# Patient Record
Sex: Female | Born: 2000 | Race: White | Hispanic: No | Marital: Single | Smoking: Never smoker
Health system: Southern US, Community
[De-identification: ages and names within clinical notes are randomized; demographics above are authoritative.]

## PROBLEM LIST (undated history)

## (undated) ENCOUNTER — Emergency Department: Admission: EM | Payer: Self-pay | Source: Home / Self Care

---
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
## 2019-06-16 ENCOUNTER — Ambulatory Visit: Payer: Self-pay | Attending: Internal Medicine

## 2019-06-16 DIAGNOSIS — Z23 Encounter for immunization: Secondary | ICD-10-CM

## 2019-06-16 NOTE — Progress Notes (Signed)
   Covid-19 Vaccination Clinic  Name:  Darlene Estrada    MRN: [id number] DOB: [birth year]  06/16/2019  [name] was observed post Covid-19 immunization for 15 minutes without incident. She was provided with Vaccine Information Sheet and instruction to access the V-Safe system.   [name] was instructed to call 911 with any severe reactions post vaccine: Marland Kitchen Difficulty breathing  . Swelling of face and throat  . A fast heartbeat  . A bad rash all over body  . Dizziness and weakness   Immunizations Administered    Name Date Dose VIS Date Route   Pfizer COVID-19 Vaccine 06/16/2019  5:23 PM 0.3 mL 03/08/2019 Intramuscular   Manufacturer: [employer], [employer]   Lot: [id number]   NDC: [address]-0

## 2019-07-07 ENCOUNTER — Ambulatory Visit: Payer: Self-pay | Attending: Internal Medicine

## 2019-07-07 DIAGNOSIS — Z23 Encounter for immunization: Secondary | ICD-10-CM

## 2019-07-07 NOTE — Progress Notes (Signed)
   Covid-19 Vaccination Clinic  Name:  Darlene Estrada    MRN: [id number] DOB: 12/04/2000  07/07/2019  [name] was observed post Covid-19 immunization for 15 minutes without incident. She was provided with Vaccine Information Sheet and instruction to access the V-Safe system.   [name] was instructed to call 911 with any severe reactions post vaccine: Marland Kitchen Difficulty breathing  . Swelling of face and throat  . A fast heartbeat  . A bad rash all over body  . Dizziness and weakness   Immunizations Administered    Name Date Dose VIS Date Route   Pfizer COVID-19 Vaccine 07/07/2019  4:26 PM 0.3 mL 03/08/2019 Intramuscular   Manufacturer: [employer], [employer]   Lot: [id number]   NDC: [address]-2

## 2019-12-17 ENCOUNTER — Other Ambulatory Visit (HOSPITAL_COMMUNITY)
Admission: RE | Admit: 2019-12-17 | Discharge: 2019-12-17 | Disposition: A | Discharge status: Home / Self Care | Payer: Managed Care, Other (non HMO) | Source: Ambulatory Visit | Attending: Obstetrics and Gynecology | Admitting: Obstetrics and Gynecology

## 2019-12-17 ENCOUNTER — Ambulatory Visit (INDEPENDENT_AMBULATORY_CARE_PROVIDER_SITE_OTHER): Payer: Managed Care, Other (non HMO) | Admitting: Obstetrics and Gynecology

## 2019-12-17 ENCOUNTER — Other Ambulatory Visit: Payer: Self-pay

## 2019-12-17 ENCOUNTER — Encounter: Payer: Self-pay | Admitting: Obstetrics and Gynecology

## 2019-12-17 VITALS — BP 114/70 | HR 89 | Resp 16 | Ht 63.0 in | Wt 145.2 lb

## 2019-12-17 DIAGNOSIS — Z01419 Encounter for gynecological examination (general) (routine) without abnormal findings: Secondary | ICD-10-CM | POA: Diagnosis not present

## 2019-12-17 DIAGNOSIS — Z0000 Encounter for general adult medical examination without abnormal findings: Secondary | ICD-10-CM | POA: Diagnosis not present

## 2019-12-17 DIAGNOSIS — Z113 Encounter for screening for infections with a predominantly sexual mode of transmission: Secondary | ICD-10-CM | POA: Diagnosis not present

## 2019-12-17 DIAGNOSIS — R300 Dysuria: Secondary | ICD-10-CM

## 2019-12-17 DIAGNOSIS — Z3041 Encounter for surveillance of contraceptive pills: Secondary | ICD-10-CM | POA: Diagnosis not present

## 2019-12-17 MED ORDER — DROSPIRENONE-ETHINYL ESTRADIOL 3-0.03 MG PO TABS: 1.0000 | ORAL_TABLET | Freq: Every day | ORAL | 4 refills | Status: DC

## 2019-12-17 NOTE — Progress Notes (Signed)
Gynecology Annual Exam  PCP: Pcp, No  Chief Complaint:  Chief Complaint  Patient presents with  . Gynecologic Exam    annual exam    History of Present Illness: Patient is a 19 y.o. No obstetric history on file. presents for annual exam. The patient has no complaints today.   LMP: Patient's last menstrual period was 10/19/2019. Average Interval: regular, monthly Duration of flow: 5 days Heavy Menses: hx of menorrhagia- improved with OCP Clots: no Intermenstrual Bleeding: no Postcoital Bleeding: no Dysmenorrhea: yes, moderate, improved with OCP  The patient is sexually active. She currently uses OCP (estrogen/progesterone) for contraception. She denies dyspareunia.  The patient does perform self breast exams.  There is no notable family history of breast or ovarian cancer in her family.  The patient wears seatbelts: yes.  The patient has regular exercise: yes.    Review of Systems: Review of Systems  Constitutional: Negative for chills, fever, malaise/fatigue and weight loss.  HENT: Negative for congestion, hearing loss and sinus pain.   Eyes: Negative for blurred vision and double vision.  Respiratory: Negative for cough, sputum production, shortness of breath and wheezing.   Cardiovascular: Negative for chest pain, palpitations, orthopnea and leg swelling.  Gastrointestinal: Negative for abdominal pain, constipation, diarrhea, nausea and vomiting.  Genitourinary: Negative for dysuria, flank pain, frequency, hematuria and urgency.  Musculoskeletal: Negative for back pain, falls and joint pain.  Skin: Negative for itching and rash.  Neurological: Negative for dizziness and headaches.  Psychiatric/Behavioral: Negative for depression, substance abuse and suicidal ideas. The patient is not nervous/anxious.     Past Medical History:  There are no problems to display for this patient.   Past Surgical History:  History reviewed. No pertinent surgical  history.  Gynecologic History:  Patient's last menstrual period was 10/19/2019.  Reports a history of a 10 cm dermoid cyst on left ovary, laparoscopic removal, small remaining ovary.  Contraception: OCP (estrogen/progesterone) Pap: iniate at  20  Obstetric History: No obstetric history on file.  Family History:  History reviewed. No pertinent family history.  Social History:  Social History   Socioeconomic History  . Marital status: Single    Spouse name: Not on file  . Number of children: Not on file  . Years of education: Not on file  . Highest education level: Not on file  Occupational History  . Not on file  Tobacco Use  . Smoking status: Never Smoker  . Smokeless tobacco: Never Used  Vaping Use  . Vaping Use: Never assessed  Substance and Sexual Activity  . Alcohol use: Not on file    Comment: occ  . Drug use: Never  . Sexual activity: Yes    Birth control/protection: Pill  Other Topics Concern  . Not on file  Social History Narrative  . Not on file   Social Determinants of Health   Financial Resource Strain:   . Difficulty of Paying Living Expenses: Not on file  Food Insecurity:   . Worried About Programme researcher, broadcasting/film/video in the Last Year: Not on file  . Ran Out of Food in the Last Year: Not on file  Transportation Needs:   . Lack of Transportation (Medical): Not on file  . Lack of Transportation (Non-Medical): Not on file  Physical Activity:   . Days of Exercise per Week: Not on file  . Minutes of Exercise per Session: Not on file  Stress:   . Feeling of Stress : Not on file  Social Connections:   . Frequency of Communication with Friends and Family: Not on file  . Frequency of Social Gatherings with Friends and Family: Not on file  . Attends Religious Services: Not on file  . Active Member of Clubs or Organizations: Not on file  . Attends Banker Meetings: Not on file  . Marital Status: Not on file  Intimate Partner Violence:   . Fear of  Current or Ex-Partner: Not on file  . Emotionally Abused: Not on file  . Physically Abused: Not on file  . Sexually Abused: Not on file    Allergies:  Allergies  Allergen Reactions  . Gluten Meal Other (See Comments)    Severe stomach pain    Medications: Prior to Admission medications   Medication Sig Start Date End Date Taking? Authorizing Provider  drospirenone-ethinyl estradiol (YASMIN) 3-0.03 MG tablet Take 1 tablet by mouth daily.    [provider]    Physical Exam Vitals: Blood pressure 114/70, pulse 89, resp. rate 16, height 5\' 3"  (1.6 m), weight 145 lb 3.2 oz (65.9 kg), last menstrual period 10/19/2019, SpO2 98 %.  General: NAD HEENT: normocephalic, anicteric Thyroid: no enlargement, no palpable nodules Pulmonary: No increased work of breathing, CTAB Cardiovascular: RRR, distal pulses 2+ Breast: Breast symmetrical, no tenderness, no palpable nodules or masses, no skin or nipple retraction present, no nipple discharge.  No axillary or supraclavicular lymphadenopathy. Abdomen: NABS, soft, non-tender, non-distended.  Umbilicus without lesions.  No hepatomegaly, splenomegaly or masses palpable. No evidence of hernia  Genitourinary:  External: Normal external female genitalia.  Normal urethral meatus, normal Bartholin's and Skene's glands.    Vagina: Normal vaginal mucosa, no evidence of prolapse.    Cervix: Grossly normal in appearance, no bleeding  Uterus: Non-enlarged, mobile, normal contour.  No CMT  Adnexa: ovaries non-enlarged, no adnexal masses  Rectal: deferred  Lymphatic: no evidence of inguinal lymphadenopathy Extremities: no edema, erythema, or tenderness Neurologic: Grossly intact Psychiatric: mood appropriate, affect full  Female chaperone present for pelvic and breast  portions of the physical exam    Assessment: 19 y.o. No obstetric history on file. routine annual exam  Plan: Problem List Items Addressed This Visit    None    Visit  Diagnoses    Encounter for gynecological examination without abnormal finding    -  Primary   Health maintenance examination       Encounter for annual routine gynecological examination       Screen for STD (sexually transmitted disease)       Relevant Orders   Cervicovaginal ancillary only     1) STI screening  was offered and accepted  2)  ASCCP guidelines and rational discussed.  Patient opts for iniation at 20.  3) Contraception - the patient is currently using  OCP (estrogen/progesterone).  She is happy with her current form of contraception and plans to continue We discussed safe sex practices to reduce her furture risk of STI's.    4) Return in about 1 year (around 12/16/2020) for annual.   12/18/2020 MD, Adelene Idler OB/[facility]N, [facility] 12/17/2019 11:13 AM

## 2019-12-17 NOTE — Patient Instructions (Signed)
Exercising to Stay Healthy To become healthy and stay healthy, it is recommended that you do moderate-intensity and vigorous-intensity exercise. You can tell that you are exercising at a moderate intensity if your heart starts beating faster and you start breathing faster but can still hold a conversation. You can tell that you are exercising at a vigorous intensity if you are breathing much harder and faster and cannot hold a conversation while exercising. Exercising regularly is important. It has many health benefits, such as:  Improving overall fitness, flexibility, and endurance.  Increasing bone density.  Helping with weight control.  Decreasing body fat.  Increasing muscle strength.  Reducing stress and tension.  Improving overall health. How often should I exercise? Choose an activity that you enjoy, and set realistic goals. Your health care provider can help you make an activity plan that works for you. Exercise regularly as told by your health care provider. This may include:  Doing strength training two times a week, such as: ? Lifting weights. ? Using resistance bands. ? Push-ups. ? Sit-ups. ? Yoga.  Doing a certain intensity of exercise for a given amount of time. Choose from these options: ? A total of 150 minutes of moderate-intensity exercise every week. ? A total of 75 minutes of vigorous-intensity exercise every week. ? A mix of moderate-intensity and vigorous-intensity exercise every week. Children, pregnant women, people who have not exercised regularly, people who are overweight, and older adults may need to talk with a health care provider about what activities are safe to do. If you have a medical condition, be sure to talk with your health care provider before you start a new exercise program. What are some exercise ideas? Moderate-intensity exercise ideas include:  Walking 1 mile (1.6 km) in about 15  minutes.  Biking.  Hiking.  Golfing.  Dancing.  Water aerobics. Vigorous-intensity exercise ideas include:  Walking 4.5 miles (7.2 km) or more in about 1 hour.  Jogging or running 5 miles (8 km) in about 1 hour.  Biking 10 miles (16.1 km) or more in about 1 hour.  Lap swimming.  Roller-skating or in-line skating.  Cross-country skiing.  Vigorous competitive sports, such as football, basketball, and soccer.  Jumping rope.  Aerobic dancing. What are some everyday activities that can help me to get exercise?  Yard work, such as: ? Pushing a lawn mower. ? Raking and bagging leaves.  Washing your car.  Pushing a stroller.  Shoveling snow.  Gardening.  Washing windows or floors. How can I be more active in my day-to-day activities?  Use stairs instead of an elevator.  Take a walk during your lunch break.  If you drive, park your car farther away from your work or school.  If you take public transportation, get off one stop early and walk the rest of the way.  Stand up or walk around during all of your indoor phone calls.  Get up, stretch, and walk around every 30 minutes throughout the day.  Enjoy exercise with a friend. Support to continue exercising will help you keep a regular routine of activity. What guidelines can I follow while exercising?  Before you start a new exercise program, talk with your health care provider.  Do not exercise so much that you hurt yourself, feel dizzy, or get very short of breath.  Wear comfortable clothes and wear shoes with good support.  Drink plenty of water while you exercise to prevent dehydration or heat stroke.  Work out until your breathing   and your heartbeat get faster. Where to find more information  [place] Department of Health and Human Services: www.hhs.gov  Centers for Disease Control and Prevention (CDC): www.cdc.gov Summary  Exercising regularly is important. It will improve your overall fitness,  flexibility, and endurance.  Regular exercise also will improve your overall health. It can help you control your weight, reduce stress, and improve your bone density.  Do not exercise so much that you hurt yourself, feel dizzy, or get very short of breath.  Before you start a new exercise program, talk with your health care provider. This information is not intended to replace advice given to you by your health care provider. Make sure you discuss any questions you have with your health care provider. Document Revised: 02/24/2017 Document Reviewed: 02/02/2017 Elsevier Patient Education  2020 Else[employer] Inc.   Budget-Friendly Healthy Eating There are many ways to save money at the grocery store and continue to eat healthy. You can be successful if you:  Plan meals according to your budget.  Make a grocery list and only purchase food according to your grocery list.  Prepare food yourself. What are tips for following this plan?  Reading food labels  Compare food labels between brand name foods and the store brand. Often the nutritional value is the same, but the store brand is lower cost.  Look for products that do not have added sugar, fat, or salt (sodium). These often cost the same but are healthier for you. Products may be labeled as: ? Sugar-free. ? Nonfat. ? Low-fat. ? Sodium-free. ? Low-sodium.  Look for lean ground beef labeled as at least 92% lean and 8% fat. Shopping  Buy only the items on your grocery list and go only to the areas of the store that have the items on your list.  Use coupons only for foods and brands you normally buy. Avoid buying items you wouldn't normally buy simply because they are on sale.  Check online and in newspapers for weekly deals.  Buy healthy items from the bulk bins when available, such as herbs, spices, flour, pasta, nuts, and dried fruit.  Buy fruits and vegetables that are in season. Prices are usually lower on in-season  produce.  Look at the unit price on the price tag. Use it to compare different brands and sizes to find out which item is the best deal.  Choose healthy items that are often low-cost, such as carrots, potatoes, apples, bananas, and oranges. Dried or canned beans are a low-cost protein source.  Buy in bulk and freeze extra food. Items you can buy in bulk include meats, fish, poultry, frozen fruits, and frozen vegetables.  Avoid buying "ready-to-eat" foods, such as pre-cut fruits and vegetables and pre-made salads.  If possible, shop around to discover where you can find the best prices. Consider other retailers such as dollar stores, larger wholesale stores, local fruit and vegetable stands, and farmers markets.  Do not shop when you are hungry. If you shop while hungry, it may be hard to stick to your list and budget.  Resist impulse buying. Use your grocery list as your official plan for the week.  Buy a variety of vegetables and fruits by purchasing fresh, frozen, and canned items.  Look at the top and bottom shelves for deals. Foods at eye level (eye level of an adult or child) are usually more expensive.  Be efficient with your time when shopping. The more time you spend at the store, the more money you   are likely to spend.  To save money when choosing more expensive foods like meats and dairy: ? Choose cheaper cuts of meat, such as bone-in chicken thighs and drumsticks instead of skinless and boneless chicken. When you are ready to prepare the chicken, you can remove the skin yourself to make it healthier. ? Choose lean meats like chicken or turkey instead of beef. ? Choose canned seafood, such as tuna, salmon, or sardines. ? Buy eggs as a low-cost source of protein. ? Buy dried beans and peas, such as lentils, split peas, or kidney beans instead of meats. Dried beans and peas are a good alternative source of protein. ? Buy the larger tubs of yogurt instead of individual-sized  containers.  Choose water instead of sodas and other sweetened beverages.  Avoid buying chips, cookies, and other "junk food." These items are usually expensive and not healthy. Cooking  Make extra food and freeze the extras in meal-sized containers or in individual portions for fast meals and snacks.  Pre-cook on days when you have extra time to prepare meals in advance. You can keep these meals in the fridge or freezer and reheat for a quick meal.  When you come home from the grocery store, wash, peel, and cut fruits and vegetables so they are ready to use and eat. This will help reduce food waste. Meal planning  Do not eat out or get fast food. Prepare food at home.  Make a grocery list and make sure to bring it with you to the store. If you have a smart phone, you could use your phone to create your shopping list.  Plan meals and snacks according to a grocery list and budget you create.  Use leftovers in your meal plan for the week.  Look for recipes where you can cook once and make enough food for two meals.  Include budget-friendly meals like stews, casseroles, and stir-fry dishes.  Try some meatless meals or try "no cook" meals like salads.  Make sure that half your plate is filled with fruits or vegetables. Choose from fresh, frozen, or canned fruits and vegetables. If eating canned, remember to rinse them before eating. This will remove any excess salt added for packaging. Summary  Eating healthy on a budget is possible if you plan your meals according to your budget, purchase according to your budget and grocery list, and prepare food yourself.  Tips for buying more food on a limited budget include buying generic brands, using coupons only for foods you normally buy, and buying healthy items from the bulk bins when available.  Tips for buying cheaper food to replace expensive food include choosing cheaper, lean cuts of meat, and buying dried beans and peas. This  information is not intended to replace advice given to you by your health care provider. Make sure you discuss any questions you have with your health care provider. Document Revised: 03/15/2017 Document Reviewed: 03/15/2017 Elsevier Patient Education  2020 [employer] Inc.   Bone Health Bones protect organs, store calcium, anchor muscles, and support the whole body. Keeping your bones strong is important, especially as you get older. You can take actions to help keep your bones strong and healthy. Why is keeping my bones healthy important?  Keeping your bones healthy is important because your body constantly replaces bone cells. Cells get old, and new cells take their place. As we age, we lose bone cells because the body may not be able to make enough new cells to replace the   old cells. The amount of bone cells and bone tissue you have is referred to as bone mass. The higher your bone mass, the stronger your bones. The aging process leads to an overall loss of bone mass in the body, which can increase the likelihood of:  Joint pain and stiffness.  Broken bones.  A condition in which the bones become weak and brittle (osteoporosis). A large decline in bone mass occurs in older adults. In women, it occurs about the time of menopause. What actions can I take to keep my bones healthy? Good health habits are important for maintaining healthy bones. This includes eating nutritious foods and exercising regularly. To have healthy bones, you need to get enough of the right minerals and vitamins. Most nutrition experts recommend getting these nutrients from the foods that you eat. In some cases, taking supplements may also be recommended. Doing certain types of exercise is also important for bone health. What are the nutritional recommendations for healthy bones?  Eating a well-balanced diet with plenty of calcium and vitamin D will help to protect your bones. Nutritional recommendations vary from person  to person. Ask your health care provider what is healthy for you. Here are some general guidelines. Get enough calcium Calcium is the most important (essential) mineral for bone health. Most people can get enough calcium from their diet, but supplements may be recommended for people who are at risk for osteoporosis. Good sources of calcium include:  Dairy products, such as low-fat or nonfat milk, cheese, and yogurt.  Dark green leafy vegetables, such as bok choy and broccoli.  Calcium-fortified foods, such as orange juice, cereal, bread, soy beverages, and tofu products.  Nuts, such as almonds. Follow these recommended amounts for daily calcium intake:  Children, age 1-3: 700 mg.  Children, age 4-8: 1,000 mg.  Children, age 9-13: 1,300 mg.  Teens, age 14-18: 1,300 mg.  Adults, age 19-50: 1,000 mg.  Adults, age 51-70: ? Men: 1,000 mg. ? Women: 1,200 mg.  Adults, age 71 or older: 1,200 mg.  Pregnant and breastfeeding females: ? Teens: 1,300 mg. ? Adults: 1,000 mg. Get enough vitamin D Vitamin D is the most essential vitamin for bone health. It helps the body absorb calcium. Sunlight stimulates the skin to make vitamin D, so be sure to get enough sunlight. If you live in a cold climate or you do not get outside often, your health care provider may recommend that you take vitamin D supplements. Good sources of vitamin D in your diet include:  Egg yolks.  Saltwater fish.  Milk and cereal fortified with vitamin D. Follow these recommended amounts for daily vitamin D intake:  Children and teens, age 1-18: 600 international units.  Adults, age 50 or younger: 400-800 international units.  Adults, age 51 or older: 800-1,000 international units. Get other important nutrients Other nutrients that are important for bone health include:  Phosphorus. This mineral is found in meat, poultry, dairy foods, nuts, and legumes. The recommended daily intake for adult men and adult women is  700 mg.  Magnesium. This mineral is found in seeds, nuts, dark green vegetables, and legumes. The recommended daily intake for adult men is 400-420 mg. For adult women, it is 310-320 mg.  Vitamin K. This vitamin is found in green leafy vegetables. The recommended daily intake is 120 mg for adult men and 90 mg for adult women. What type of physical activity is best for building and maintaining healthy bones? Weight-bearing and strength-building activities are   important for building and maintaining healthy bones. Weight-bearing activities cause muscles and bones to work against gravity. Strength-building activities increase the strength of the muscles that support bones. Weight-bearing and muscle-building activities include:  Walking and hiking.  Jogging and running.  Dancing.  Gym exercises.  Lifting weights.  Tennis and racquetball.  Climbing stairs.  Aerobics. Adults should get at least 30 minutes of moderate physical activity on most days. Children should get at least 60 minutes of moderate physical activity on most days. Ask your health care provider what type of exercise is best for you. How can I find out if my bone mass is low? Bone mass can be measured with an X-ray test called a bone mineral density (BMD) test. This test is recommended for all women who are age 65 or older. It may also be recommended for:  Men who are age 70 or older.  People who are at risk for osteoporosis because of: ? Having bones that break easily. ? Having a long-term disease that weakens bones, such as kidney disease or rheumatoid arthritis. ? Having menopause earlier than normal. ? Taking medicine that weakens bones, such as steroids, thyroid hormones, or hormone treatment for breast cancer or prostate cancer. ? Smoking. ? Drinking three or more alcoholic drinks a day. If you find that you have a low bone mass, you may be able to prevent osteoporosis or further bone loss by changing your diet and  lifestyle. Where can I find more information? For more information, check out the following websites:  National Osteoporosis Foundation: www.nof.org/patients  National Institutes of Health: www.bones.nih.gov  International Osteoporosis Foundation: www.iofbonehealth.org Summary  The aging process leads to an overall loss of bone mass in the body, which can increase the likelihood of broken bones and osteoporosis.  Eating a well-balanced diet with plenty of calcium and vitamin D will help to protect your bones.  Weight-bearing and strength-building activities are also important for building and maintaining strong bones.  Bone mass can be measured with an X-ray test called a bone mineral density (BMD) test. This information is not intended to replace advice given to you by your health care provider. Make sure you discuss any questions you have with your health care provider. Document Revised: 04/10/2017 Document Reviewed: 04/10/2017 Elsevier Patient Education  2020 Elsevier Inc.   

## 2019-12-19 LAB — CERVICOVAGINAL ANCILLARY ONLY
Chlamydia: NEGATIVE
Comment: NEGATIVE
Comment: NORMAL
Neisseria Gonorrhea: NEGATIVE

## 2019-12-19 LAB — URINE CULTURE: Organism ID, Bacteria: NO GROWTH

## 2020-10-24 ENCOUNTER — Other Ambulatory Visit: Payer: Self-pay | Admitting: Obstetrics and Gynecology

## 2020-10-24 DIAGNOSIS — Z3041 Encounter for surveillance of contraceptive pills: Secondary | ICD-10-CM

## 2020-12-29 ENCOUNTER — Ambulatory Visit
Admission: EM | Admit: 2020-12-29 | Discharge: 2020-12-29 | Disposition: A | Discharge status: Home / Self Care | Payer: Managed Care, Other (non HMO) | Attending: Emergency Medicine | Admitting: Emergency Medicine

## 2020-12-29 ENCOUNTER — Ambulatory Visit (INDEPENDENT_AMBULATORY_CARE_PROVIDER_SITE_OTHER): Payer: Managed Care, Other (non HMO)

## 2020-12-29 ENCOUNTER — Other Ambulatory Visit: Payer: Self-pay

## 2020-12-29 ENCOUNTER — Encounter: Payer: Self-pay | Admitting: Emergency Medicine

## 2020-12-29 DIAGNOSIS — M79631 Pain in right forearm: Secondary | ICD-10-CM

## 2020-12-29 MED ORDER — IBUPROFEN 600 MG PO TABS: 600.0000 mg | ORAL_TABLET | Freq: Four times a day (QID) | ORAL | 0 refills | Status: AC | PRN

## 2020-12-29 NOTE — ED Triage Notes (Signed)
Pt here with right forearm pain x 2 days. Was lifting a heavy dresser, but otherwise, no mechanism of injury.

## 2020-12-29 NOTE — Discharge Instructions (Addendum)
Take the ibuprofen as prescribed.  Rest and elevate your forearm.  Apply ice packs 2-3 times a day for up to 20 minutes each.  Wear the sling as needed for comfort.    Follow up with an orthopedist if your symptoms are not improving.

## 2020-12-29 NOTE — ED Provider Notes (Signed)
[name]    CSN: [id number] Arrival date & time: 12/29/20  0859      History   Chief Complaint Chief Complaint  Patient presents with   Arm Injury    HPI Darlene Estrada is a 20 y.o. female.  Patient presents with pain in her right forearm x2 days.  The pain started when she was lifting a heavy dresser but patient states she also has hit her arm several times while moving things in the past few days.  She denies numbness, weakness, paresthesias, wounds, bruising, or other symptoms.  She denies pertinent medical history.  LMP: Current.  The history is provided by the patient.   History reviewed. No pertinent past medical history.  There are no problems to display for this patient.   Past Surgical History:  Procedure Laterality Date   LAPAROSCOPIC OVARIAN CYSTECTOMY  2015   Left ovarian dermoid removed, 10 cm    OB History   No obstetric history on file.      Home Medications    Prior to Admission medications   Medication Sig Start Date End Date Taking? Authorizing Provider  ibuprofen (ADVIL) 600 MG tablet Take 1 tablet (600 mg total) by mouth every 6 (six) hours as needed. 12/29/20  Yes Mickie Bail, NP  drospirenone-ethinyl estradiol (YASMIN) 3-0.03 MG tablet TAKE 1 TABLET BY MOUTH EVERY DAY *[facility] BRAND ONLY* 10/24/20   Natale Milch, MD    Family History History reviewed. No pertinent family history.  Social History Social History   Tobacco Use   Smoking status: Never   Smokeless tobacco: Never  Substance Use Topics   Drug use: Never     Allergies   Gluten meal   Review of Systems Review of Systems  Constitutional:  Negative for chills and fever.  Respiratory:  Negative for cough and shortness of breath.   Cardiovascular:  Negative for chest pain and palpitations.  Gastrointestinal:  Negative for abdominal pain and vomiting.  Musculoskeletal:  Positive for arthralgias and myalgias. Negative for joint swelling.  Skin:  Negative  for color change, rash and wound.  Neurological:  Negative for weakness and numbness.  All other systems reviewed and are negative.   Physical Exam Triage Vital Signs ED Triage Vitals  Enc Vitals Group     BP      Pulse      Resp      Temp      Temp src      SpO2      Weight      Height      Head Circumference      Peak Flow      Pain Score      Pain Loc      Pain Edu?      Excl. in GC?    No data found.  Updated Vital Signs BP 138/88 (BP Location: Left Arm)   Pulse 96   Temp 99 F (37.2 C) (Oral)   Resp 18   LMP 12/28/2020 (Exact Date)   SpO2 99%   Visual Acuity Right Eye Distance:   Left Eye Distance:   Bilateral Distance:    Right Eye Near:   Left Eye Near:    Bilateral Near:     Physical Exam Vitals and nursing note reviewed.  Constitutional:      General: She is not in acute distress.    Appearance: She is well-developed. She is not ill-appearing.  HENT:     Head:  Normocephalic and atraumatic.     Mouth/Throat:     Mouth: Mucous membranes are moist.  Eyes:     Conjunctiva/sclera: Conjunctivae normal.  Cardiovascular:     Rate and Rhythm: Normal rate and regular rhythm.     Heart sounds: Normal heart sounds.  Pulmonary:     Effort: Pulmonary effort is normal. No respiratory distress.     Breath sounds: Normal breath sounds.  Abdominal:     Palpations: Abdomen is soft.     Tenderness: There is no abdominal tenderness.  Musculoskeletal:        General: Tenderness present. No swelling or deformity.       Arms:     Cervical back: Neck supple.     Comments: Right forearm tender to palpation; limited ROM due to discomfort.   Skin:    General: Skin is warm and dry.     Capillary Refill: Capillary refill takes less than 2 seconds.     Findings: No bruising, erythema, lesion or rash.  Neurological:     General: No focal deficit present.     Mental Status: She is alert and oriented to person, place, and time.     Sensory: No sensory deficit.      Motor: No weakness.     Gait: Gait normal.  Psychiatric:        Mood and Affect: Mood normal.        Behavior: Behavior normal.     UC Treatments / Results  Labs (all labs ordered are listed, but only abnormal results are displayed) Labs Reviewed - No data to display  EKG   Radiology DG Forearm Right  Result Date: 12/29/2020 CLINICAL DATA:  Right forearm pain for 2 days after lifting heavy object EXAM: RIGHT FOREARM - 2 VIEW COMPARISON:  None. FINDINGS: There is no evidence of fracture or other focal bone lesions. Soft tissues are unremarkable. IMPRESSION: Negative. Electronically Signed   By: Duanne Guess D.O.   On: 12/29/2020 10:11    Procedures Procedures (including critical care time)  Medications Ordered in UC Medications - No data to display  Initial Impression / Assessment and Plan / UC Course  I have reviewed the triage vital signs and the nursing notes.  Pertinent labs & imaging results that were available during my care of the patient were reviewed by me and considered in my medical decision making (see chart for details).    Right forearm pain.  Xray negative.  Treating with ibuprofen, rest, elevation, ice packs, sling for comfort.  Education provided on musculoskeletal pain.  Instructed patient to follow-up with orthopedics if her symptoms are not improving.  She agrees to plan of care.    Final Clinical Impressions(s) / UC Diagnoses   Final diagnoses:  Right forearm pain     Discharge Instructions      Take the ibuprofen as prescribed.  Rest and elevate your forearm.  Apply ice packs 2-3 times a day for up to 20 minutes each.  Wear the sling as needed for comfort.    Follow up with an orthopedist if your symptoms are not improving.        ED Prescriptions     Medication Sig Dispense Auth. Provider   ibuprofen (ADVIL) 600 MG tablet Take 1 tablet (600 mg total) by mouth every 6 (six) hours as needed. 30 tablet Mickie Bail, NP      PDMP  not reviewed this encounter.   Mickie Bail, NP 12/29/20 1023

## 2021-04-22 ENCOUNTER — Other Ambulatory Visit: Payer: Self-pay | Admitting: Obstetrics and Gynecology

## 2021-04-22 DIAGNOSIS — Z3041 Encounter for surveillance of contraceptive pills: Secondary | ICD-10-CM

## 2021-06-14 ENCOUNTER — Encounter: Payer: Self-pay | Admitting: Emergency Medicine

## 2021-06-14 ENCOUNTER — Other Ambulatory Visit: Payer: Self-pay

## 2021-06-14 ENCOUNTER — Ambulatory Visit
Admission: EM | Admit: 2021-06-14 | Discharge: 2021-06-14 | Disposition: A | Discharge status: Home / Self Care | Payer: Managed Care, Other (non HMO)

## 2021-06-14 DIAGNOSIS — L259 Unspecified contact dermatitis, unspecified cause: Secondary | ICD-10-CM

## 2021-06-14 MED ORDER — PREDNISONE 10 MG (21) PO TBPK: ORAL_TABLET | Freq: Every day | ORAL | 0 refills | Status: DC

## 2021-06-14 NOTE — Discharge Instructions (Addendum)
Take the prednisone taper as directed.   ? ?Take Benadryl every 6 hours as directed; do not drive, operate machinery, or drink alcohol with this medication as it may cause drowsiness.  If you need to be awake and alert, take Claritin as directed.   ? ?Follow up with your primary care provider if your symptoms are not improving.   ? ?

## 2021-06-14 NOTE — ED Provider Notes (Signed)
?[facility] ? ? ? ?CSN: [id number] ?Arrival date & time: 06/14/21  6812 ? ? ?  ? ?History   ?Chief Complaint ?Chief Complaint  ?Patient presents with  ? Rash  ? ? ?HPI ?Darlene Estrada is a 21 y.o. female.  Patient presents with her and pruritic rash on her legs x 2-3 days.  The rash started after she was in a pool at work on 06/11/2021; she is a Architectural technologist; The pool levels of chlorine and pH were off.  Treatment attempted at home by applying lotion to the rash.  She denies fever, chills, open wounds, drainage, or other symptoms.  LMP: 2 weeks ago.  ? ?The history is provided by the patient.  ? ?History reviewed. No pertinent past medical history. ? ?There are no problems to display for this patient. ? ? ?Past Surgical History:  ?Procedure Laterality Date  ? LAPAROSCOPIC OVARIAN CYSTECTOMY  2015  ? Left ovarian dermoid removed, 10 cm  ? ? ?OB History   ?No obstetric history on file. ?  ? ? ? ?Home Medications   ? ?Prior to Admission medications   ?Medication Sig Start Date End Date Taking? Authorizing Provider  ?drospirenone-ethinyl estradiol (YASMIN) 3-0.03 MG tablet TAKE 1 TABLET BY MOUTH EVERY DAY *MYLAN BRAND ONLY* 04/22/21  Yes Schuman, Christanna R, MD  ?predniSONE (STERAPRED UNI-PAK 21 TAB) 10 MG (21) TBPK tablet Take by mouth daily. As directed 06/14/21  Yes Mickie Bail, NP  ?zolpidem (AMBIEN) 10 MG tablet Take 10 mg by mouth at bedtime. 01/15/21  Yes [provider]  ?ibuprofen (ADVIL) 600 MG tablet Take 1 tablet (600 mg total) by mouth every 6 (six) hours as needed. 12/29/20   Mickie Bail, NP  ? ? ?Family History ?History reviewed. No pertinent family history. ? ?Social History ?Social History  ? ?Tobacco Use  ? Smoking status: Never  ? Smokeless tobacco: Never  ?Vaping Use  ? Vaping Use: Never used  ?Substance Use Topics  ? Drug use: Never  ? ? ? ?Allergies   ?Gluten meal ? ? ?Review of Systems ?Review of Systems  ?Skin:  Positive for rash. Negative for color change.   ?All other systems reviewed and are negative. ? ? ?Physical Exam ?Triage Vital Signs ?ED Triage Vitals  ?Enc Vitals Group  ?   BP 06/14/21 0852 128/86  ?   Pulse Rate 06/14/21 0852 (!) 104  ?   Resp 06/14/21 0852 18  ?   Temp 06/14/21 0852 98.2 ?F (36.8 ?C)  ?   Temp src --   ?   SpO2 06/14/21 0852 98 %  ?   Weight --   ?   Height --   ?   Head Circumference --   ?   Peak Flow --   ?   Pain Score 06/14/21 0859 4  ?   Pain Loc --   ?   Pain Edu? --   ?   Excl. in GC? --   ? ?No data found. ? ?Updated Vital Signs ?BP 128/86   Pulse (!) 104   Temp 98.2 ?F (36.8 ?C)   Resp 18   SpO2 98%  ? ?Visual Acuity ?Right Eye Distance:   ?Left Eye Distance:   ?Bilateral Distance:   ? ?Right Eye Near:   ?Left Eye Near:    ?Bilateral Near:    ? ?Physical Exam ?Vitals and nursing note reviewed.  ?Constitutional:   ?   General: She is not  in acute distress. ?   Appearance: She is well-developed. She is not ill-appearing.  ?HENT:  ?   Mouth/Throat:  ?   Mouth: Mucous membranes are moist.  ?Cardiovascular:  ?   Rate and Rhythm: Normal rate and regular rhythm.  ?   Heart sounds: Normal heart sounds.  ?Pulmonary:  ?   Effort: Pulmonary effort is normal. No respiratory distress.  ?   Breath sounds: Normal breath sounds.  ?Musculoskeletal:  ?   Cervical back: Neck supple.  ?Skin: ?   General: Skin is warm and dry.  ?   Findings: Rash present.  ?   Comments: Faint, light pink hive-like rash on bilateral inner thighs and lower legs.  No open wounds or drainage.  ?Neurological:  ?   Mental Status: She is alert.  ?Psychiatric:     ?   Mood and Affect: Mood normal.     ?   Behavior: Behavior normal.  ? ? ? ?UC Treatments / Results  ?Labs ?(all labs ordered are listed, but only abnormal results are displayed) ?Labs Reviewed - No data to display ? ?EKG ? ? ?Radiology ?No results found. ? ?Procedures ?Procedures (including critical care time) ? ?Medications Ordered in UC ?Medications - No data to display ? ?Initial Impression / Assessment and  Plan / UC Course  ?I have reviewed the triage vital signs and the nursing notes. ? ?Pertinent labs & imaging results that were available during my care of the patient were reviewed by me and considered in my medical decision making (see chart for details). ? ?Contact dermatitis.  Treating with prednisone taper and Benadryl.  Precautions for drowsiness with Benadryl discussed.  Work note provided per patient request.  Instructed patient to follow-up with her PCP if her symptoms are not improving.  She agrees to plan of care. ? ? ?Final Clinical Impressions(s) / UC Diagnoses  ? ?Final diagnoses:  ?Contact dermatitis, unspecified contact dermatitis type, unspecified trigger  ? ? ? ?Discharge Instructions   ? ?  ?Take the prednisone taper as directed.   ? ?Take Benadryl every 6 hours as directed; do not drive, operate machinery, or drink alcohol with this medication as it may cause drowsiness.  If you need to be awake and alert, take Claritin as directed.   ? ?Follow up with your primary care provider if your symptoms are not improving.   ? ? ? ? ? ?ED Prescriptions   ? ? Medication Sig Dispense Auth. Provider  ? predniSONE (STERAPRED UNI-PAK 21 TAB) 10 MG (21) TBPK tablet Take by mouth daily. As directed 21 tablet Mickie Bail, NP  ? ?  ? ?PDMP not reviewed this encounter. ?  ?Mickie Bail, NP ?06/14/21 [phone] ? ?

## 2021-06-14 NOTE — ED Triage Notes (Signed)
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 When she got out the pool she noticed some redness and burning sensation on her legs x 2 days.  ?

## 2021-10-06 DIAGNOSIS — D649 Anemia, unspecified: Secondary | ICD-10-CM

## 2021-10-06 DIAGNOSIS — E538 Deficiency of other specified B group vitamins: Secondary | ICD-10-CM

## 2021-10-06 HISTORY — DX: Deficiency of other specified B group vitamins: E53.8

## 2021-10-06 HISTORY — DX: Anemia, unspecified: D64.9

## 2021-11-18 ENCOUNTER — Ambulatory Visit (INDEPENDENT_AMBULATORY_CARE_PROVIDER_SITE_OTHER): Payer: 59 | Admitting: Medical

## 2021-11-18 ENCOUNTER — Encounter: Payer: Self-pay | Admitting: Medical

## 2021-11-18 ENCOUNTER — Other Ambulatory Visit: Payer: Self-pay

## 2021-11-18 VITALS — BP 132/70 | HR 92 | Temp 98.0°F | Ht 64.57 in | Wt 186.0 lb

## 2021-11-18 DIAGNOSIS — R059 Cough, unspecified: Secondary | ICD-10-CM | POA: Diagnosis not present

## 2021-11-18 MED ORDER — BENZONATATE 100 MG PO CAPS
ORAL_CAPSULE | ORAL | 0 refills | Status: DC
Start: 2021-11-18 — End: 2022-04-12

## 2021-11-18 NOTE — Progress Notes (Incomplete)
[facility] [address] [place], [place] [address] Phone: [phone] Fax: [phone]   Office Visit Note  Patient Name: Darlene Estrada  Date of Birth:03-23-2001  Med Rec number [id number]  Date of Service: 11/18/2021  Gluten meal  Chief Complaint  Patient presents with  . Cough     HPI Patient notes cough since beginning of June. 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 Cough seemed to be in her chest initially, was productive of yellow mucus. Saw PCP in mid-late June, was prescribed an Advair inhaler, used for 4-6 weeks, ran out about 2 weeks ago. Cough did not completely resolve but got better while using the inhaler. Had less mucus and less frequent coughing.  Felt that cough got worse couple days ago, not as deep in chest as before. Cough brought on by laughing or activity. Some worse when lying down. Little more out of breath when walking around. Denies hx of seasonal allergies. Feels little mucus in throat last couple days, not enough to expectorate. Denies fever or chills, doesn't feel like she is getting sick. Slight sore throat when she swallows. No runny/stuffy nose. Used Robitussin in June, made her drowsy but did not help cough.   Does not smoke/vape, no significant secondhand exposure.   Current Medication:  Outpatient Encounter Medications as of 11/18/2021  Medication Sig  . BD INTEGRA SYRINGE 25G X 1" 3 ML MISC 1 ML EVERY 2 WEEKS BUY ALCOHOL WIPES  . benzonatate (TESSALON) 100 MG capsule One or two capsules by mouth every 8 hours as needed for cough  . cyanocobalamin (VITAMIN B12) 1000 MCG/ML injection Inject 1,000 mcg into the muscle every 14 (fourteen) days.  . drospirenone-ethinyl estradiol (YASMIN) 3-0.03 MG tablet TAKE 1 TABLET BY MOUTH EVERY DAY *MYLAN BRAND ONLY*  . ibuprofen (ADVIL) 600 MG tablet Take 1 tablet (600 mg total) by mouth every 6 (six) hours as needed.  . zolpidem (AMBIEN) 10 MG tablet Take 10 mg by  mouth at bedtime.  . [DISCONTINUED] predniSONE (STERAPRED UNI-PAK 21 TAB) 10 MG (21) TBPK tablet Take by mouth daily. As directed (Patient not taking: Reported on 11/18/2021)   No facility-administered encounter medications on file as of 11/18/2021.      Medical History: Past Medical History:  Diagnosis Date  . Anemia 10/06/2021  . Vitamin B12 deficiency 10/06/2021     Vital Signs: BP 132/70   Pulse 92   Temp 98 F (36.7 C) (Tympanic)   Ht 5' 4.57" (1.64 m)   Wt 186 lb (84.4 kg)   SpO2 99%   BMI 31.37 kg/m    Review of Systems  Constitutional:  Negative for chills and fever.  HENT:  Positive for sore throat (mild). Negative for congestion and rhinorrhea.   Respiratory:  Positive for cough and shortness of breath (slight, with activity).   Cardiovascular:  Negative for chest pain.    Physical Exam Vitals reviewed.  Constitutional:      General: She is not in acute distress.    Appearance: She is not ill-appearing.  HENT:     Head: Normocephalic.     Right Ear: Tympanic membrane, ear canal and external ear normal.     Left Ear: Tympanic membrane, ear canal and external ear normal.     Nose: Nose normal. No mucosal edema, congestion or rhinorrhea.     Comments: Slightly swollen turbinates    Mouth/Throat:     Mouth: Mucous membranes are moist. No oral  lesions.     Pharynx: Oropharynx is clear. Uvula midline. No pharyngeal swelling or posterior oropharyngeal erythema.     Comments: Tonsils not inflamed Cardiovascular:     Rate and Rhythm: Normal rate and regular rhythm.     Heart sounds: No murmur heard.    No friction rub. No gallop.  Pulmonary:     Effort: Pulmonary effort is normal.     Breath sounds: Normal breath sounds. No wheezing, rhonchi or rales.  Musculoskeletal:     Cervical back: Neck supple.  Lymphadenopathy:     Cervical: No cervical adenopathy.  Neurological:     Mental Status: She is alert.      Assessment/Plan: 1. Cough, unspecified  type Discussed reassuring exam findings and vital signs. -Recommended using Flonase/Fluticasone nasal spray, 2 sprays to each nostril once a day. - benzonatate (TESSALON) 100 MG capsule; One or two capsules by mouth every 8 hours as needed for cough  Dispense: 30 capsule; Refill: 0 -Make sure you are getting plenty of rest. Stay well-hydrated    Discussed scheduling an appointment for assistance learning to give herself B12 injections.   General Counseling: Darlene Estrada verbalizes understanding of the findings of todays visit and agrees with plan of treatment. I have discussed any further diagnostic evaluation that may be needed or ordered today. We also reviewed her medications today. she has been encouraged to call the office with any questions or concerns that should arise related to todays visit.   No orders of the defined types were placed in this encounter.   Meds ordered this encounter  Medications  . benzonatate (TESSALON) 100 MG capsule    Sig: One or two capsules by mouth every 8 hours as needed for cough    Dispense:  30 capsule    Refill:  0    Order Specific Question:   Supervising Provider    Answer:   Darlene Estrada [[id number]]    Time spent:*** Minutes    Darlene Resides PA-C [facility] 11/18/2021 11:55 PM

## 2021-11-18 NOTE — Progress Notes (Signed)
[facility] [address] [place], [place] [address] Phone: [phone] Fax: [phone]   Office Visit Note  Patient Name: Darlene Estrada  Date of Birth:September 19, 2000  Med Rec number [id number]  Date of Service: 11/18/2021  Gluten meal  Chief Complaint  Patient presents with   Cough     HPI Patient notes cough since beginning of June. 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 Cough seemed to be in her chest initially, was productive of yellow mucus. Saw PCP in mid-late June, was prescribed an Advair inhaler, used for 4-6 weeks, ran out about 2 weeks ago. Cough did not completely resolve but got better while using the inhaler. Had less mucus and less frequent coughing.  Felt that cough got worse couple days ago, not as deep in chest as before. Cough brought on by laughing or activity. Some worse when lying down. Little more out of breath when walking around. Denies hx of seasonal allergies. Feels little mucus in throat last couple days, not enough to expectorate. Denies fever or chills, doesn't feel like she is getting sick. Slight sore throat when she swallows. No runny/stuffy nose. Used Robitussin in June, made her drowsy but did not help cough.   Does not smoke/vape, no significant secondhand exposure.   Current Medication:  Outpatient Encounter Medications as of 11/18/2021  Medication Sig   BD INTEGRA SYRINGE 25G X 1" 3 ML MISC 1 ML EVERY 2 WEEKS BUY ALCOHOL WIPES   benzonatate (TESSALON) 100 MG capsule One or two capsules by mouth every 8 hours as needed for cough   cyanocobalamin (VITAMIN B12) 1000 MCG/ML injection Inject 1,000 mcg into the muscle every 14 (fourteen) days.   drospirenone-ethinyl estradiol (YASMIN) 3-0.03 MG tablet TAKE 1 TABLET BY MOUTH EVERY DAY *MYLAN BRAND ONLY*   ibuprofen (ADVIL) 600 MG tablet Take 1 tablet (600 mg total) by mouth every 6 (six) hours as needed.   zolpidem (AMBIEN) 10 MG tablet Take 10 mg by mouth at  bedtime.   [DISCONTINUED] predniSONE (STERAPRED UNI-PAK 21 TAB) 10 MG (21) TBPK tablet Take by mouth daily. As directed (Patient not taking: Reported on 11/18/2021)   No facility-administered encounter medications on file as of 11/18/2021.      Medical History: Past Medical History:  Diagnosis Date   Anemia 10/06/2021   Vitamin B12 deficiency 10/06/2021     Vital Signs: BP 132/70   Pulse 92   Temp 98 F (36.7 C) (Tympanic)   Ht 5' 4.57" (1.64 m)   Wt 186 lb (84.4 kg)   SpO2 99%   BMI 31.37 kg/m    Review of Systems  Constitutional:  Negative for chills and fever.  HENT:  Positive for sore throat (mild). Negative for congestion and rhinorrhea.   Respiratory:  Positive for cough and shortness of breath (slight, with activity).   Cardiovascular:  Negative for chest pain.    Physical Exam Vitals reviewed.  Constitutional:      General: She is not in acute distress.    Appearance: She is not ill-appearing.  HENT:     Head: Normocephalic.     Right Ear: Tympanic membrane, ear canal and external ear normal.     Left Ear: Tympanic membrane, ear canal and external ear normal.     Nose: Nose normal. No mucosal edema, congestion or rhinorrhea.     Comments: Slightly swollen turbinates    Mouth/Throat:     Mouth: Mucous membranes are moist. No oral  lesions.     Pharynx: Oropharynx is clear. Uvula midline. No pharyngeal swelling or posterior oropharyngeal erythema.     Comments: Tonsils not inflamed Cardiovascular:     Rate and Rhythm: Normal rate and regular rhythm.     Heart sounds: No murmur heard.    No friction rub. No gallop.  Pulmonary:     Effort: Pulmonary effort is normal.     Breath sounds: Normal breath sounds. No wheezing, rhonchi or rales.  Musculoskeletal:     Cervical back: Neck supple.  Lymphadenopathy:     Cervical: No cervical adenopathy.  Neurological:     Mental Status: She is alert.      Assessment/Plan: 1. Cough, unspecified type Discussed  reassuring exam findings and vital signs. -Recommended using Flonase/Fluticasone nasal spray, 2 sprays to each nostril once a day. - benzonatate (TESSALON) 100 MG capsule; One or two capsules by mouth every 8 hours as needed for cough  Dispense: 30 capsule; Refill: 0 -Make sure you are getting plenty of rest. Stay well-hydrated by drinking water. Use cough drops/throat lozenges and drink warm liquids (i.e. teat with honey) as needed for cough. -Send my chart message to provider or schedule return visit as needed for new/worsening symptoms or if symptoms do not improve as discussed with recommended treatment over next 7 days.    Discussed scheduling an appointment for assistance learning to give herself B12 injections.   General Counseling: Nyia verbalizes understanding of the findings of todays visit and agrees with plan of treatment. I have discussed any further diagnostic evaluation that may be needed or ordered today. We also reviewed her medications today. she has been encouraged to call the office with any questions or concerns that should arise related to todays visit.   No orders of the defined types were placed in this encounter.   Meds ordered this encounter  Medications   benzonatate (TESSALON) 100 MG capsule    Sig: One or two capsules by mouth every 8 hours as needed for cough    Dispense:  30 capsule    Refill:  0    Order Specific Question:   Supervising Provider    Answer:   Noralee Stain [[id number]]    Time spent: 20 Minutes    Jonathon Resides PA-C [facility] 11/18/2021 11:55 PM

## 2021-11-19 NOTE — Patient Instructions (Signed)
-  Use Flonase/Fluticasone nasal spray, 2 sprays to each nostril once a day. -Try Benzonatate (TESSALON) 100 MG capsule as needed for cough -Make sure you are getting plenty of rest. Stay well-hydrated by drinking water. Use cough drops/throat lozenges and drink warm liquids (i.e. teat with honey) as needed for cough. -Send my chart message to provider or schedule return visit as needed for new/worsening symptoms or if symptoms do not improve as discussed with recommended treatment over next 7 days.    Schedule an appointment if you would like assistance learning to give yourself B12 injections.

## 2021-11-26 ENCOUNTER — Ambulatory Visit: Payer: 59 | Admitting: Medical

## 2022-01-06 ENCOUNTER — Encounter: Payer: Self-pay | Admitting: Family Medicine

## 2022-01-11 ENCOUNTER — Encounter: Payer: Self-pay | Admitting: Adult Health

## 2022-01-11 ENCOUNTER — Other Ambulatory Visit (INDEPENDENT_AMBULATORY_CARE_PROVIDER_SITE_OTHER): Payer: Managed Care, Other (non HMO) | Admitting: Adult Health

## 2022-01-11 VITALS — HR 110 | Temp 96.6°F

## 2022-01-11 DIAGNOSIS — E8881 Metabolic syndrome: Secondary | ICD-10-CM

## 2022-01-11 NOTE — Progress Notes (Signed)
[address] [place], [place] [address] Phone: [phone] Fax: [phone]   Office Visit Note  Patient Name: Darlene Estrada  Date of WIOXB:[id number]  Med Rec number [id number]  Date of Service: 01/11/2022  Gluten meal  Chief Complaint  Patient presents with   Lab work     HPI [name] is here for blood draw.  She has orders from Dr. Milton Ferguson in [place].     Current Medication:  Outpatient Encounter Medications as of 01/11/2022  Medication Sig   BD INTEGRA SYRINGE 25G X 1" 3 ML MISC 1 ML EVERY 2 WEEKS BUY ALCOHOL WIPES   benzonatate (TESSALON) 100 MG capsule One or two capsules by mouth every 8 hours as needed for cough (Patient not taking: Reported on 01/11/2022)   cyanocobalamin (VITAMIN B12) 1000 MCG/ML injection Inject 1,000 mcg into the muscle every 14 (fourteen) days.   drospirenone-ethinyl estradiol (YASMIN) 3-0.03 MG tablet TAKE 1 TABLET BY MOUTH EVERY DAY *MYLAN BRAND ONLY*   ibuprofen (ADVIL) 600 MG tablet Take 1 tablet (600 mg total) by mouth every 6 (six) hours as needed.   zolpidem (AMBIEN) 10 MG tablet Take 10 mg by mouth at bedtime.   No facility-administered encounter medications on file as of 01/11/2022.      Medical History: Past Medical History:  Diagnosis Date   Anemia 10/06/2021   Vitamin B12 deficiency 10/06/2021     Vital Signs: Pulse (!) 110   Temp (!) 96.6 F (35.9 C) (Tympanic)   SpO2 98%    Review of Systems  Constitutional:  Negative for fatigue and fever.    Physical Exam Constitutional:      Appearance: Normal appearance.  Neurological:     Mental Status: She is alert.     Assessment/Plan: 1. Metabolic syndrome Fax results to Dr. Milton Ferguson MD at [phone] Phone number [phone] Pt tolerated well.  Denies further need.  - HgB A1c - Insulin, random - CBC w/Diff - Basic metabolic panel - Hepatic function panel     General Counseling: Lacresha verbalizes understanding of the findings of  todays visit and agrees with plan of treatment. I have discussed any further diagnostic evaluation that may be needed or ordered today. We also reviewed her medications today. she has been encouraged to call the office with any questions or concerns that should arise related to todays visit.   Orders Placed This Encounter  Procedures   HgB A1c   Insulin, random   CBC w/Diff   Basic metabolic panel   Hepatic function panel    No orders of the defined types were placed in this encounter.   Time spent:15 Minutes Time spent includes review of chart, medications, test results, and follow up plan with the patient.    Kendell Bane AGNP-C Nurse Practitioner

## 2022-01-12 LAB — BASIC METABOLIC PANEL
BUN/Creatinine Ratio: 13 (ref 9–23)
BUN: 8 mg/dL (ref 6–20)
CO2: 20 mmol/L (ref 20–29)
Calcium: 9.3 mg/dL (ref 8.7–10.2)
Chloride: 101 mmol/L (ref 96–106)
Creatinine, Ser: 0.61 mg/dL (ref 0.57–1.00)
Glucose: 107 mg/dL — ABNORMAL HIGH (ref 70–99)
Potassium: 3.8 mmol/L (ref 3.5–5.2)
Sodium: 140 mmol/L (ref 134–144)
eGFR: 130 mL/min/{1.73_m2} (ref >59)

## 2022-01-12 LAB — CBC WITH DIFFERENTIAL/PLATELET
Basophils Absolute: 0 10*3/uL (ref 0.0–0.2)
Basos: 1 %
EOS (ABSOLUTE): 0 10*3/uL (ref 0.0–0.4)
Eos: 1 %
Hematocrit: 38.1 % (ref 34.0–46.6)
Hemoglobin: 12 g/dL (ref 11.1–15.9)
Immature Grans (Abs): 0 10*3/uL (ref 0.0–0.1)
Immature Granulocytes: 0 %
Lymphocytes Absolute: 2.4 10*3/uL (ref 0.7–3.1)
Lymphs: 33 %
MCH: 25.3 pg — ABNORMAL LOW (ref 26.6–33.0)
MCHC: 31.5 g/dL (ref 31.5–35.7)
MCV: 80 fL (ref 79–97)
Monocytes Absolute: 0.4 10*3/uL (ref 0.1–0.9)
Monocytes: 6 %
Neutrophils Absolute: 4.2 10*3/uL (ref 1.4–7.0)
Neutrophils: 59 %
Platelets: 267 10*3/uL (ref 150–450)
RBC: 4.74 x10E6/uL (ref 3.77–5.28)
RDW: 13.6 % (ref 11.7–15.4)
WBC: 7.1 10*3/uL (ref 3.4–10.8)

## 2022-01-12 LAB — HEPATIC FUNCTION PANEL
ALT: 9 IU/L (ref 0–32)
AST: 13 IU/L (ref 0–40)
Albumin: 3.4 g/dL — ABNORMAL LOW (ref 4.0–5.0)
Alkaline Phosphatase: 50 IU/L (ref 44–121)
Bilirubin Total: 0.4 mg/dL (ref 0.0–1.2)
Bilirubin, Direct: 0.12 mg/dL (ref 0.00–0.40)
Total Protein: 6.6 g/dL (ref 6.0–8.5)

## 2022-01-12 LAB — HEMOGLOBIN A1C
Est. average glucose Bld gHb Est-mCnc: 114 mg/dL
Hgb A1c MFr Bld: 5.6 % (ref 4.8–5.6)

## 2022-01-12 LAB — INSULIN, RANDOM: INSULIN: 34.7 u[IU]/mL — ABNORMAL HIGH (ref 2.6–24.9)

## 2022-04-12 ENCOUNTER — Encounter: Payer: Self-pay | Admitting: Family Medicine

## 2022-04-12 ENCOUNTER — Ambulatory Visit (INDEPENDENT_AMBULATORY_CARE_PROVIDER_SITE_OTHER): Payer: 59 | Admitting: Family Medicine

## 2022-04-12 VITALS — BP 132/72 | HR 123 | Temp 102.9°F | Resp 18 | Ht 64.0 in | Wt 181.0 lb

## 2022-04-12 DIAGNOSIS — R509 Fever, unspecified: Secondary | ICD-10-CM

## 2022-04-12 DIAGNOSIS — J111 Influenza due to unidentified influenza virus with other respiratory manifestations: Secondary | ICD-10-CM

## 2022-04-12 LAB — POC SOFIA 2 FLU + SARS ANTIGEN FIA
Influenza A, POC: POSITIVE — AB
Influenza B, POC: NEGATIVE
SARS Coronavirus 2 Ag: NEGATIVE

## 2022-04-12 NOTE — Progress Notes (Signed)
[address] [place], [place] [address] Phone: [phone] Fax: [phone]   Office Visit Note  Patient Name: Darlene Estrada  Date of Birth:12/28/2000  Med Rec number [id number]  Date of Service: 04/12/2022  Gluten meal  Chief Complaint  Patient presents with   Influenza     Has had fever, headcahe, sore throat and occasional cough Has not had her flu shot yet  Has insulin resistance and is on Mounjaro - had routine blood work yesterday Blood sugars have been higher than normal for the past 3 days - highest 175  Influenza Associated symptoms include chills, congestion, coughing and a fever.      Current Medication:  Outpatient Encounter Medications as of 04/12/2022  Medication Sig   BD INTEGRA SYRINGE 25G X 1" 3 ML MISC 1 ML EVERY 2 WEEKS BUY ALCOHOL WIPES   cyanocobalamin (VITAMIN B12) 1000 MCG/ML injection Inject 1,000 mcg into the muscle every 14 (fourteen) days.   drospirenone-ethinyl estradiol (YASMIN) 3-0.03 MG tablet TAKE 1 TABLET BY MOUTH EVERY DAY *MYLAN BRAND ONLY*   ibuprofen (ADVIL) 600 MG tablet Take 1 tablet (600 mg total) by mouth every 6 (six) hours as needed.   zolpidem (AMBIEN) 10 MG tablet Take 10 mg by mouth at bedtime.   [DISCONTINUED] benzonatate (TESSALON) 100 MG capsule One or two capsules by mouth every 8 hours as needed for cough (Patient not taking: Reported on 01/11/2022)   No facility-administered encounter medications on file as of 04/12/2022.      Medical History: Past Medical History:  Diagnosis Date   Anemia 10/06/2021   Vitamin B12 deficiency 10/06/2021     Vital Signs: BP 132/72   Pulse (!) 123   Temp (!) 102.9 F (39.4 C) (Tympanic)   Resp 18   Ht 5\' 4"  (1.626 m)   Wt 181 lb (82.1 kg)   SpO2 99%   BMI 31.07 kg/m    Review of Systems  Constitutional:  Positive for chills and fever.  HENT:  Positive for congestion and postnasal drip.   Respiratory:  Positive for cough. Negative for shortness of  breath.     Physical Exam Vitals reviewed.  Constitutional:      Appearance: Normal appearance. She is not ill-appearing.  HENT:     Right Ear: Tympanic membrane normal.     Left Ear: Tympanic membrane normal.     Nose: Congestion and rhinorrhea present.     Mouth/Throat:     Pharynx: No oropharyngeal exudate or posterior oropharyngeal erythema.  Eyes:     Conjunctiva/sclera: Conjunctivae normal.  Pulmonary:     Effort: Pulmonary effort is normal.     Breath sounds: Normal breath sounds.  Lymphadenopathy:     Cervical: No cervical adenopathy.  Neurological:     Mental Status: She is alert.       Assessment/Plan:  1. Fever, unspecified fever cause Gave student 1,000 mg tylenol in the exam room - POC SOFIA 2 FLU + SARS ANTIGEN FIA  2. Flu Do not return to class until temperature is below 100.5 Expect blood sugars to return to normal as infection resolves Results for orders placed or performed in visit on 04/12/22 (from the past 24 hour(s))  POC SOFIA 2 FLU + SARS ANTIGEN FIA     Status: Abnormal   Collection Time: 04/12/22  3:13 PM  Result Value Ref Range   Influenza A, POC Positive (A) Negative   Influenza B, POC Negative Negative   SARS Coronavirus 2  Ag Negative Negative         General Counseling: Darlene Estrada verbalizes understanding of the findings of todays visit and agrees with plan of treatment. I have discussed any further diagnostic evaluation that may be needed or ordered today. We also reviewed her medications today. she has been encouraged to call the office with any questions or concerns that should arise related to todays visit.   No orders of the defined types were placed in this encounter.   No orders of the defined types were placed in this encounter.    Dr Evette Doffing [place] AB[facility] University Physician

## 2022-11-23 ENCOUNTER — Encounter: Payer: Self-pay | Admitting: Family Medicine

## 2022-11-23 ENCOUNTER — Ambulatory Visit (INDEPENDENT_AMBULATORY_CARE_PROVIDER_SITE_OTHER): Payer: 59 | Admitting: Family Medicine

## 2022-11-23 ENCOUNTER — Other Ambulatory Visit: Payer: Self-pay

## 2022-11-23 VITALS — BP 120/61 | HR 99 | Temp 98.4°F | Ht 64.17 in | Wt 162.0 lb

## 2022-11-23 DIAGNOSIS — S91209A Unspecified open wound of unspecified toe(s) with damage to nail, initial encounter: Secondary | ICD-10-CM | POA: Diagnosis not present

## 2022-11-23 NOTE — Progress Notes (Signed)
[facility] [address] [place], [place] [address] Phone: [phone] Fax: [phone]   Office Visit Note  Patient Name: Darlene Estrada  Date of Birth:September 02, 2000  Med Rec number [id number]  Date of Service: 11/23/2022  Patient has no known allergies.  Chief Complaint  Patient presents with   Toe Pain      Stubbed right middle toe earlier today and toenail lifted - still attached at one side but concerned for possible infection risk Has covered it with a bandaid  Still on same dose of Mounjaro and has lost 35 pounds since December Sugars range between 100 and 150      Current Medication:  Outpatient Encounter Medications as of 11/23/2022  Medication Sig   Continuous Glucose Receiver (DEXCOM G7 RECEIVER) DEVI SMARTSIG:Via Meter   Continuous Glucose Sensor (DEXCOM G7 SENSOR) MISC See admin instructions.   LORazepam (ATIVAN) 0.5 MG tablet Take 0.5 mg by mouth every 8 (eight) hours.   MOUNJARO 5 MG/0.5ML Pen SMARTSIG:5 Milligram(s) SUB-Q Once a Week   drospirenone-ethinyl estradiol (YASMIN) 3-0.03 MG tablet TAKE 1 TABLET BY MOUTH EVERY DAY *MYLAN BRAND ONLY*   ibuprofen (ADVIL) 600 MG tablet Take 1 tablet (600 mg total) by mouth every 6 (six) hours as needed.   [DISCONTINUED] BD INTEGRA SYRINGE 25G X 1" 3 ML MISC 1 ML EVERY 2 WEEKS BUY ALCOHOL WIPES   [DISCONTINUED] cyanocobalamin (VITAMIN B12) 1000 MCG/ML injection Inject 1,000 mcg into the muscle every 14 (fourteen) days.   [DISCONTINUED] zolpidem (AMBIEN) 10 MG tablet Take 10 mg by mouth at bedtime.   No facility-administered encounter medications on file as of 11/23/2022.      Medical History: Past Medical History:  Diagnosis Date   Anemia 10/06/2021   Vitamin B12 deficiency 10/06/2021     Vital Signs: BP 120/61   Pulse 99   Temp 98.4 F (36.9 C) (Tympanic)   Ht 5' 4.17" (1.63 m)   Wt 162 lb (73.5 kg)   SpO2 99%   BMI 27.66 kg/m    Review of Systems  Constitutional: Negative.      Physical Exam Vitals reviewed.  Constitutional:      Appearance: Normal appearance.  Skin:    General: Skin is warm.     Capillary Refill: Capillary refill takes less than 2 seconds.     Comments: Toenail attached at lateral edge No active bleeding now  Neurological:     Mental Status: She is alert.     Assessment/Plan:  1. Toenail avulsion, initial encounter  No acute treatment needed Keep toe and nail covered by bandaid so does not catch nail again and increase avulsion  If nail does become problematic and "snags" on things she can return and we can remove nail. Since no damage to nail bed, nail should regrow normally   General Counseling: Darlene Estrada verbalizes understanding of the findings of todays visit and agrees with plan of treatment. I have discussed any further diagnostic evaluation that may be needed or ordered today. We also reviewed her medications today. she has been encouraged to call the office with any questions or concerns that should arise related to todays visit.   No orders of the defined types were placed in this encounter.   No orders of the defined types were placed in this encounter.   Dr Durwin Reges Aja Whitehair [facility] University Physician

## 2023-03-02 IMAGING — DX DG FOREARM 2V*R*
2 series · 2 of 2 positions shown · non-contrast
Comparison: None.

CLINICAL DATA: Right forearm pain for 2 days after lifting heavy
object

EXAM:
RIGHT FOREARM - 2 VIEW

[forearm ap]
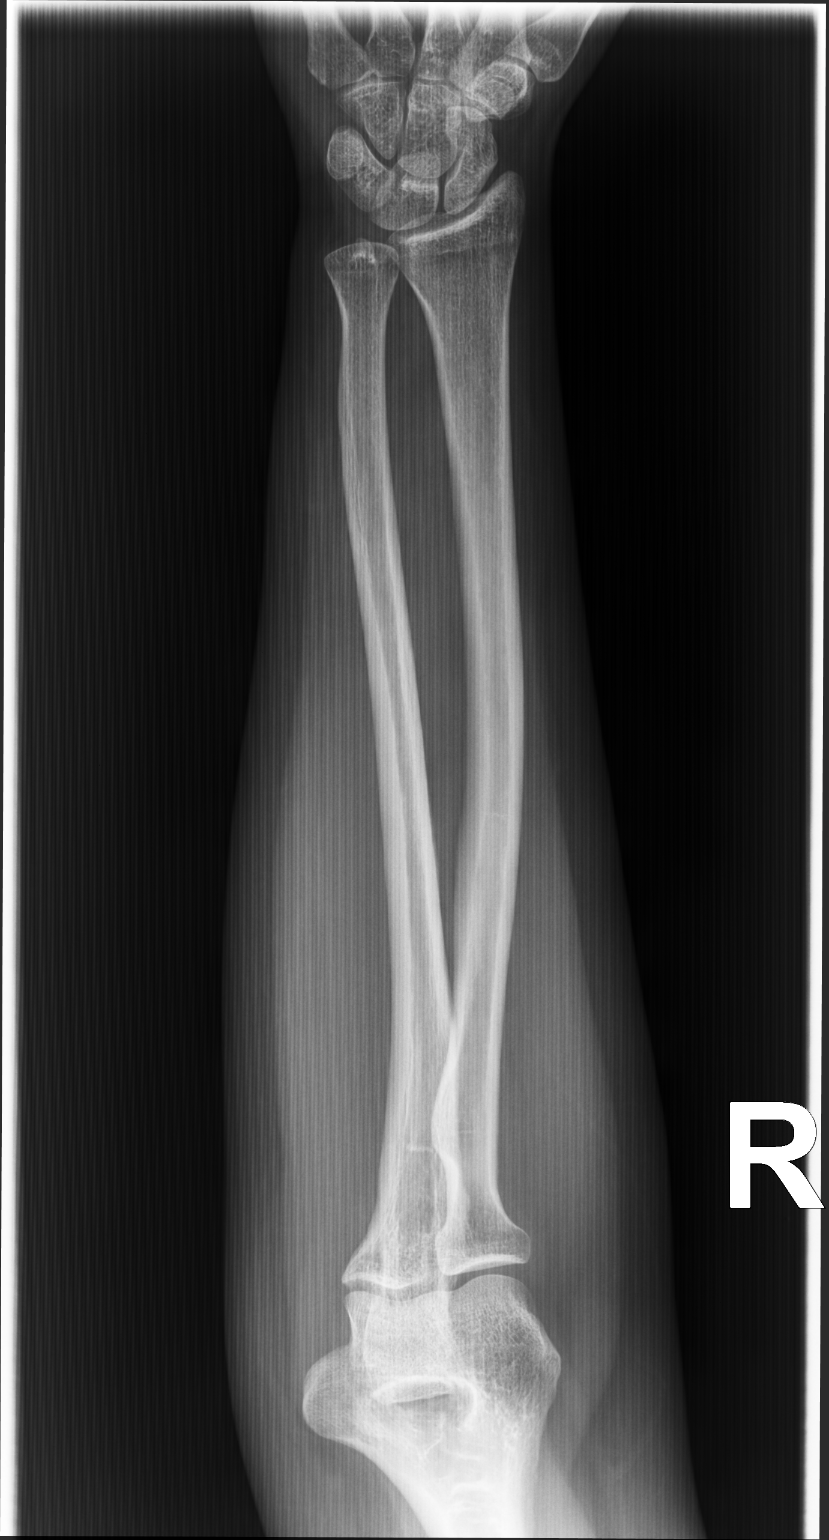

[forearm lat]
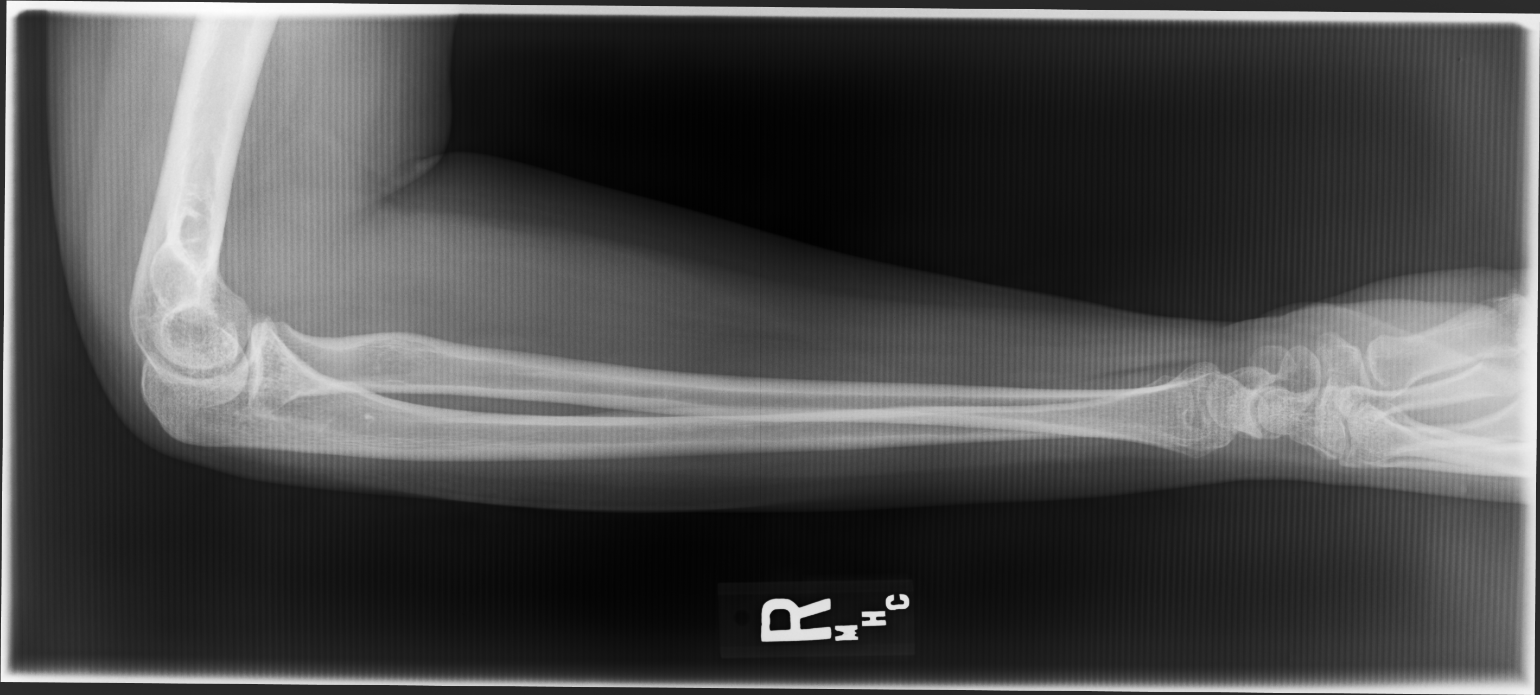

[2 of 2 positions shown; findings below may reference images not displayed]

FINDINGS: There is no evidence of fracture or other focal bone lesions. Soft
tissues are unremarkable.
IMPRESSION: Negative.

## 2023-09-25 ENCOUNTER — Encounter: Payer: Self-pay | Admitting: Emergency Medicine

## 2023-09-25 ENCOUNTER — Ambulatory Visit
Admission: EM | Admit: 2023-09-25 | Discharge: 2023-09-25 | Disposition: A | Discharge status: Home / Self Care | Attending: Emergency Medicine | Admitting: Emergency Medicine

## 2023-09-25 DIAGNOSIS — R220 Localized swelling, mass and lump, head: Secondary | ICD-10-CM | POA: Diagnosis present

## 2023-09-25 DIAGNOSIS — K1370 Unspecified lesions of oral mucosa: Secondary | ICD-10-CM | POA: Diagnosis not present

## 2023-09-25 MED ORDER — ACYCLOVIR 5 % EX OINT: 1.0000 | TOPICAL_OINTMENT | CUTANEOUS | 1 refills | Status: AC

## 2023-09-25 NOTE — ED Provider Notes (Signed)
 [facility]    CSN: [id number] Arrival date & time: 09/25/23  0807      History   Chief Complaint Chief Complaint  Patient presents with   Oral Swelling   Blister    HPI Darlene Estrada is a 23 y.o. female.   Patient presents for evaluation of blistering and swelling present to the upper and lower lips beginning 4 days ago.  Has progressively gotten worse over the past 24 hours with more blisters presenting causing more pain.  Endorses cracking of the lip causing bleeding but denies purulent drainage.  First occurrence.  Has attempted lots of lip balm.  No known sick contacts.  Denies changes in diet, toiletries or medications.  Past Medical History:  Diagnosis Date   Anemia 10/06/2021   Vitamin B12 deficiency 10/06/2021    There are no active problems to display for this patient.   Past Surgical History:  Procedure Laterality Date   LAPAROSCOPIC OVARIAN CYSTECTOMY  2015   Left ovarian dermoid removed, 10 cm    OB History   No obstetric history on file.      Home Medications    Prior to Admission medications   Medication Sig Start Date End Date Taking? Authorizing Provider  acyclovir ointment (ZOVIRAX) 5 % Apply 1 Application topically every 3 (three) hours. 09/25/23  Yes Cendy Oconnor R, NP  MOUNJARO 7.5 MG/0.5ML Pen Inject 7.5 mg into the skin once a week. 08/29/23  Yes [provider]  Continuous Glucose Receiver (DEXCOM G7 RECEIVER) DEVI SMARTSIG:Via Meter 07/18/22   [provider]  Continuous Glucose Sensor (DEXCOM G7 SENSOR) MISC See admin instructions. 11/17/22   [provider]  drospirenone -ethinyl estradiol  (YASMIN ) 3-0.03 MG tablet TAKE 1 TABLET BY MOUTH EVERY DAY *MYLAN BRAND ONLY* 04/22/21   Schuman, Christanna R, MD  ibuprofen  (ADVIL ) 600 MG tablet Take 1 tablet (600 mg total) by mouth every 6 (six) hours as needed. 12/29/20   Corlis Burnard DEL, NP  LORazepam (ATIVAN) 0.5 MG tablet Take 0.5 mg by mouth every 8 (eight) hours.     [provider]  MOUNJARO 5 MG/0.5ML Pen SMARTSIG:5 Milligram(s) SUB-Q Once a Week 10/19/22   [provider]    Family History History reviewed. No pertinent family history.  Social History Social History   Tobacco Use   Smoking status: Never   Smokeless tobacco: Never  Vaping Use   Vaping status: Never Used  Substance Use Topics   Alcohol use: Yes    Comment: occ   Drug use: Never     Allergies   Patient has no known allergies.   Review of Systems Review of Systems   Physical Exam Triage Vital Signs ED Triage Vitals  Encounter Vitals Group     BP 09/25/23 0828 120/87     Girls Systolic BP Percentile --      Girls Diastolic BP Percentile --      Boys Systolic BP Percentile --      Boys Diastolic BP Percentile --      Pulse Rate 09/25/23 0828 (!) 101     Resp 09/25/23 0828 18     Temp 09/25/23 0828 98.7 F (37.1 C)     Temp Source 09/25/23 0828 Oral     SpO2 09/25/23 0828 98 %     Weight --      Height --      Head Circumference --      Peak Flow --      Pain Score  09/25/23 0830 3     Pain Loc --      Pain Education --      Exclude from Growth Chart --    No data found.  Updated Vital Signs BP 120/87 (BP Location: Left Arm)   Pulse (!) 101   Temp 98.7 F (37.1 C) (Oral)   Resp 18   SpO2 98%   Visual Acuity Right Eye Distance:   Left Eye Distance:   Bilateral Distance:    Right Eye Near:   Left Eye Near:    Bilateral Near:     Physical Exam Constitutional:      Appearance: Normal appearance.   Eyes:     Extraocular Movements: Extraocular movements intact.   Pulmonary:     Effort: Pulmonary effort is normal.   Skin:    Comments: Scattered blistering present to the upper and lower lips with mild to moderate lip swelling, no drainage noted, tender to palpation, no erythema present   Neurological:     Mental Status: She is alert and oriented to person, place, and time. Mental status is at baseline.      UC  Treatments / Results  Labs (all labs ordered are listed, but only abnormal results are displayed) Labs Reviewed  HSV 1/2 PCR (SURFACE)    EKG   Radiology No results found.  Procedures Procedures (including critical care time)  Medications Ordered in UC Medications - No data to display  Initial Impression / Assessment and Plan / UC Course  I have reviewed the triage vital signs and the nursing notes.  Pertinent labs & imaging results that were available during my care of the patient were reviewed by me and considered in my medical decision making (see chart for details).  Mouth lesion, lip swelling  Does not appear to be bacterial, concern for HSV 1, cultures obtained, pending, prescribed valacyclovir topically and discussed administration, recommended supportive care, declined prednisone  for treatment of pain and swelling, recommended over-the-counter supportive measures and advised follow-up as needed Final Clinical Impressions(s) / UC Diagnoses   Final diagnoses:  Mouth lesion  Lip swelling     Discharge Instructions      Today you are evaluated for your lip lesion  At this time does not appear to be a bacterial infection as you are not experiencing drainage, redness   Possibly a cold sore which is caused by a virus within the herpes family, this is not the same as genital herpes, can cause blistering which can cause itching pain and swelling  Apply acyclovir ointment to the lips every 3 hours for the next week or until symptoms resolve, this helps to reduce the amount of virus present and helps to clear the blistering  You may take ibuprofen  as needed for pain in addition to Tylenol  You may help cool to warm compresses over the affected area and 10 to 15 minutes to help with pain  At any point if you begin to experience puslike drainage please return for reevaluation  May return if symptoms generally do not improve   ED Prescriptions     Medication Sig  Dispense Auth. Provider   acyclovir ointment (ZOVIRAX) 5 % Apply 1 Application topically every 3 (three) hours. 15 g Teresa Shelba SAUNDERS, NP      PDMP not reviewed this encounter.   Teresa Shelba SAUNDERS, [place] 09/25/23 [phone]

## 2023-09-25 NOTE — ED Triage Notes (Signed)
 Blisters and lip swelling  that started Friday.

## 2023-09-25 NOTE — Discharge Instructions (Signed)
 Today you are evaluated for your lip lesion  At this time does not appear to be a bacterial infection as you are not experiencing drainage, redness   Possibly a cold sore which is caused by a virus within the herpes family, this is not the same as genital herpes, can cause blistering which can cause itching pain and swelling  Apply acyclovir ointment to the lips every 3 hours for the next week or until symptoms resolve, this helps to reduce the amount of virus present and helps to clear the blistering  You may take ibuprofen  as needed for pain in addition to Tylenol  You may help cool to warm compresses over the affected area and 10 to 15 minutes to help with pain  At any point if you begin to experience puslike drainage please return for reevaluation  May return if symptoms generally do not improve

## 2023-09-26 LAB — HSV 1/2 PCR (SURFACE)
HSV-1 DNA: NOT DETECTED
HSV-2 DNA: NOT DETECTED

## 2023-11-16 ENCOUNTER — Other Ambulatory Visit: Payer: Self-pay

## 2023-11-16 ENCOUNTER — Emergency Department
Admission: EM | Admit: 2023-11-16 | Discharge: 2023-11-17 | Disposition: A | Discharge status: Home / Self Care | Attending: Emergency Medicine | Admitting: Emergency Medicine

## 2023-11-16 DIAGNOSIS — R42 Dizziness and giddiness: Secondary | ICD-10-CM | POA: Diagnosis present

## 2023-11-16 DIAGNOSIS — E876 Hypokalemia: Secondary | ICD-10-CM | POA: Insufficient documentation

## 2023-11-16 LAB — CBC
HCT: 38.3 % (ref 36.0–46.0)
Hemoglobin: 13 g/dL (ref 12.0–15.0)
MCH: 28.1 pg (ref 26.0–34.0)
MCHC: 33.9 g/dL (ref 30.0–36.0)
MCV: 82.7 fL (ref 80.0–100.0)
Platelets: 234 K/uL (ref 150–400)
RBC: 4.63 MIL/uL (ref 3.87–5.11)
RDW: 12.8 % (ref 11.5–15.5)
WBC: 7.5 K/uL (ref 4.0–10.5)
nRBC: 0 % (ref 0.0–0.2)

## 2023-11-16 LAB — POC URINE PREG, ED: Preg Test, Ur: NEGATIVE

## 2023-11-16 LAB — URINALYSIS, ROUTINE W REFLEX MICROSCOPIC
Bilirubin Urine: NEGATIVE
Glucose, UA: NEGATIVE mg/dL
Ketones, ur: 5 mg/dL — AB
Nitrite: NEGATIVE
Protein, ur: NEGATIVE mg/dL
Specific Gravity, Urine: 1.009 (ref 1.005–1.030)
pH: 6 (ref 5.0–8.0)

## 2023-11-16 LAB — TROPONIN I (HIGH SENSITIVITY)
Troponin I (High Sensitivity): <2 ng/L (ref <18)
Troponin I (High Sensitivity): <2 ng/L (ref <18)

## 2023-11-16 LAB — COMPREHENSIVE METABOLIC PANEL WITH GFR
ALT: 15 U/L (ref 0–44)
AST: 20 U/L (ref 15–41)
Albumin: 4 g/dL (ref 3.5–5.0)
Alkaline Phosphatase: 24 U/L — ABNORMAL LOW (ref 38–126)
Anion gap: 14 (ref 5–15)
BUN: 8 mg/dL (ref 6–20)
CO2: 18 mmol/L — ABNORMAL LOW (ref 22–32)
Calcium: 9.6 mg/dL (ref 8.9–10.3)
Chloride: 105 mmol/L (ref 98–111)
Creatinine, Ser: 0.62 mg/dL (ref 0.44–1.00)
GFR, Estimated: >60 mL/min (ref >60)
Glucose, Bld: 98 mg/dL (ref 70–99)
Potassium: 3.1 mmol/L — ABNORMAL LOW (ref 3.5–5.1)
Sodium: 137 mmol/L (ref 135–145)
Total Bilirubin: 1 mg/dL (ref 0.0–1.2)
Total Protein: 7.7 g/dL (ref 6.5–8.1)

## 2023-11-16 LAB — MAGNESIUM: Magnesium: 2 mg/dL (ref 1.7–2.4)

## 2023-11-16 MED ORDER — MECLIZINE HCL 25 MG PO TABS
50.0000 mg | ORAL_TABLET | Freq: Once | ORAL | Status: AC
  Administered 2023-11-16: 50 mg via ORAL
  Filled 2023-11-16: qty 2

## 2023-11-16 MED ORDER — SODIUM CHLORIDE 0.9 % IV BOLUS (SEPSIS)
1000.0000 mL | Freq: Once | INTRAVENOUS | Status: AC
  Administered 2023-11-16: 1000 mL via INTRAVENOUS

## 2023-11-16 MED ORDER — POTASSIUM CHLORIDE CRYS ER 20 MEQ PO TBCR
40.0000 meq | EXTENDED_RELEASE_TABLET | Freq: Once | ORAL | Status: AC
  Administered 2023-11-16: 40 meq via ORAL
  Filled 2023-11-16: qty 2

## 2023-11-16 NOTE — ED Provider Notes (Signed)
 [facility] Provider Note    Event Date/Time   First MD Initiated Contact with Patient 11/16/23 2323     (approximate)   History   Near Syncope   HPI  Darlene Estrada is a 23 y.o. 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  She feels like the room is spinning and feels like she is going to pass out.  No chest pain or shortness of breath.  No vomiting.  Has had some diarrhea but attributes this to Mounjaro.  Has never had similar symptoms.  No numbness, tingling, weakness.  No headache, head injury.  Does report feeling some discomfort intermittently in her left ear.  Denies history of PE, DVT.  No calf tenderness or calf swelling.  No recent prolonged immobilization.  She is on birth control but is not a smoker.  Patient did report being out in the heat today.   History provided by patient, mother.    Past Medical History:  Diagnosis Date   Anemia 10/06/2021   Vitamin B12 deficiency 10/06/2021    Past Surgical History:  Procedure Laterality Date   LAPAROSCOPIC OVARIAN CYSTECTOMY  2015   Left ovarian dermoid removed, 10 cm    MEDICATIONS:  Prior to Admission medications   Medication Sig Start Date End Date Taking? Authorizing Provider  acyclovir  ointment (ZOVIRAX ) 5 % Apply 1 Application topically every 3 (three) hours. 09/25/23   Teresa Shelba SAUNDERS, NP  Continuous Glucose Receiver (DEXCOM G7 RECEIVER) DEVI SMARTSIG:Via Meter 07/18/22   [provider]  Continuous Glucose Sensor (DEXCOM G7 SENSOR) MISC See admin instructions. 11/17/22   [provider]  drospirenone -ethinyl estradiol  (YASMIN ) 3-0.03 MG tablet TAKE 1 TABLET BY MOUTH EVERY DAY *MYLAN BRAND ONLY* 04/22/21   Schuman, Christanna R, MD  ibuprofen  (ADVIL ) 600 MG tablet Take 1 tablet (600 mg total) by mouth every 6 (six) hours as needed.  12/29/20   Corlis Burnard DEL, NP  LORazepam (ATIVAN) 0.5 MG tablet Take 0.5 mg by mouth every 8 (eight) hours.    [provider]  MOUNJARO 5 MG/0.5ML Pen SMARTSIG:5 Milligram(s) SUB-Q Once a Week 10/19/22   [provider]  MOUNJARO 7.5 MG/0.5ML Pen Inject 7.5 mg into the skin once a week. 08/29/23   [provider]    Physical Exam   Triage Vital Signs: ED Triage Vitals  Encounter Vitals Group     BP 11/16/23 1826 (!) 148/94     Girls Systolic BP Percentile --      Girls Diastolic BP Percentile --      Boys Systolic BP Percentile --      Boys Diastolic BP Percentile --      Pulse Rate 11/16/23 1826 (!) 112     Resp 11/16/23 1826 18     Temp 11/16/23 1826 99.4 F (37.4 C)     Temp Source 11/16/23 1826 Oral     SpO2 11/16/23 1826 100 %     Weight --      Height --      Head Circumference --      Peak Flow --      Pain Score 11/16/23 1830 0     Pain Loc --      Pain Education --      Exclude from Growth Chart --     Most recent vital signs: Vitals:   11/17/23 0107  11/17/23 0220  BP: 121/80 122/88  Pulse: (!) 117 (!) 109  Resp: (!) 22 18  Temp:  98.8 F (37.1 C)  SpO2: 100% 100%    CONSTITUTIONAL: Alert, responds appropriately to questions.  Appears uncomfortable, anxious, tremulous HEAD: Normocephalic, atraumatic EYES: Conjunctivae clear, pupils appear equal, sclera nonicteric ENT: normal nose; moist mucous membranes; TMs are clear bilaterally without erythema, purulence, bulging, perforation, effusion.  No cerumen impaction or sign of foreign body in the external auditory canal. No inflammation, erythema or drainage from the external auditory canal. No signs of mastoiditis. No pain with manipulation of the pinna bilaterally. NECK: Supple, normal ROM CARD: Regular and tachycardic; S1 and S2 appreciated RESP: Normal chest excursion without splinting or tachypnea; breath sounds clear and equal bilaterally; no wheezes, no rhonchi, no rales, no hypoxia  or respiratory distress, speaking full sentences ABD/GI: Non-distended; soft, non-tender, no rebound, no guarding, no peritoneal signs BACK: The back appears normal EXT: Normal ROM in all joints; no deformity noted, no edema, no calf tenderness or calf swelling SKIN: Normal color for age and race; warm; no rash on exposed skin NEURO: Moves all extremities equally, normal speech, normal sensation, no facial asymmetry PSYCH: The patient's mood and manner are appropriate.   ED Results / Procedures / Treatments   LABS: (all labs ordered are listed, but only abnormal results are displayed) Labs Reviewed  COMPREHENSIVE METABOLIC PANEL WITH GFR - Abnormal; Notable for the following components:      Result Value   Potassium 3.1 (*)    CO2 18 (*)    Alkaline Phosphatase 24 (*)    All other components within normal limits  URINALYSIS, ROUTINE W REFLEX MICROSCOPIC - Abnormal; Notable for the following components:   Color, Urine YELLOW (*)    APPearance CLEAR (*)    Hgb urine dipstick SMALL (*)    Ketones, ur 5 (*)    Leukocytes,Ua TRACE (*)    Bacteria, UA MANY (*)    All other components within normal limits  CBC  MAGNESIUM  CK  TSH  POC URINE PREG, ED  TROPONIN I (HIGH SENSITIVITY)  TROPONIN I (HIGH SENSITIVITY)     EKG:  EKG Interpretation Date/Time:  Thursday November 16 2023 18:33:26 EDT Ventricular Rate:  107 PR Interval:  122 QRS Duration:  86 QT Interval:  512 QTC Calculation: 683 R Axis:   71  Text Interpretation:  Critical Test Result: Long QTc Sinus tachycardia Lateral infarct , age undetermined ST & T wave abnormality, consider inferior ischemia Prolonged QT Abnormal ECG No previous ECGs available Confirmed by Neomi Neptune [phone]) on 11/16/2023 11:32:18 PM          EKG Interpretation Date/Time:  Friday November 17 2023 00:07:05 EDT Ventricular Rate:  114 PR Interval:  152 QRS Duration:  87 QT Interval:  320 QTC Calculation: 441 R Axis:   87  Text  Interpretation: Sinus tachycardia Borderline Q waves in lateral leads Borderline repolarization abnormality Confirmed by Neomi Neptune [id number]) on 11/17/2023 12:14:53 AM         RADIOLOGY: My personal review and interpretation of imaging:    I have personally reviewed all radiology reports.   No results found.   PROCEDURES:  Critical Care performed: No   CRITICAL CARE Performed by: Neptune Neomi   Total critical care time: 0 minutes  Critical care time was exclusive of separately billable procedures and treating other patients.  Critical care was necessary to treat or prevent imminent or life-threatening deterioration.  Critical  care was time spent personally by me on the following activities: development of treatment plan with patient and/or surrogate as well as nursing, discussions with consultants, evaluation of patient's response to treatment, examination of patient, obtaining history from patient or surrogate, ordering and performing treatments and interventions, ordering and review of laboratory studies, ordering and review of radiographic studies, pulse oximetry and re-evaluation of patient's condition.   SABRA1-3 Lead EKG Interpretation  Performed by: Tylan Briguglio, Josette SAILOR, DO Authorized by: Valborg Friar, Josette SAILOR, DO     Interpretation: abnormal     ECG rate:  115   ECG rate assessment: tachycardic     Rhythm: sinus tachycardia     Ectopy: none     Conduction: normal       IMPRESSION / MDM / ASSESSMENT AND PLAN / ED COURSE  I reviewed the triage vital signs and the nursing notes.    Patient here for vertigo, near syncope.  The patient is on the cardiac monitor to evaluate for evidence of arrhythmia and/or significant heart rate changes.   DIFFERENTIAL DIAGNOSIS (includes but not limited to):   Peripheral vertigo, doubt stroke, intracranial hemorrhage, meningitis.  Differential also includes anemia, electrolyte derangement, dehydration, rhabdomyolysis, thyroid dysfunction,  UTI, pregnancy.  Doubt ACS, PE, dissection.   Patient's presentation is most consistent with acute presentation with potential threat to life or bodily function.   PLAN: Patient's hemoglobin is normal.  Potassium of 3.1.  Will give replacement.  Magnesium level normal.  EKG shows sinus tachycardia and prolonged QT interval however no prior history of the same.  This does not seem like cardiogenic near syncope.  Will repeat EKG once heart rate has improved.  I think her heart rate is elevated because she is incredibly anxious here today.  Her urine does show some small ketones so she will get additional IV fluids.  Will give meclizine .  She is very worried about having a potential allergic reaction to any of the medications we gave her but we have attempted to reassure her.  Her troponin is negative x 2.  She is not having any chest pain, shortness of breath.  She is on birth control but low suspicion for PE.   MEDICATIONS GIVEN IN ED: Medications  sodium chloride  0.9 % bolus 1,000 mL (0 mLs Intravenous Stopped 11/17/23 0114)  potassium chloride  SA (KLOR-CON  M) CR tablet 40 mEq (40 mEq Oral Given 11/16/23 2350)  meclizine  (ANTIVERT ) tablet 50 mg (50 mg Oral Given 11/16/23 2352)     ED COURSE: Heart rate has improved to the 90s.  Repeat EKG shows normal QT interval.  Patient reports feeling better after IV fluids, meclizine  and now able to move her head and ambulate without difficulty.  Tolerating p.o.  Suspect peripheral vertigo.  Will give instructions for Epley maneuvers at home and discharged with meclizine , Zofran .  Will give ENT follow-up if symptoms return or are persistent.  Patient and mother comfortable with this plan.   At this time, I do not feel there is any life-threatening condition present. I reviewed all nursing notes, vitals, pertinent previous records.  All lab and urine results, EKGs, imaging ordered have been independently reviewed and interpreted by myself.  I reviewed all  available radiology reports from any imaging ordered this visit.  Based on my assessment, I feel the patient is safe to be discharged home without further emergent workup and can continue workup as an outpatient as needed. Discussed all findings, treatment plan as well as usual and customary return  precautions.  They verbalize understanding and are comfortable with this plan.  Outpatient follow-up has been provided as needed.  All questions have been answered.    CONSULTS:  none   OUTSIDE RECORDS REVIEWED: Reviewed student health notes at [facility].       FINAL CLINICAL IMPRESSION(S) / ED DIAGNOSES   Final diagnoses:  Vertigo     Rx / DC Orders   ED Discharge Orders          Ordered    meclizine  (ANTIVERT ) 25 MG tablet  3 times daily PRN        11/17/23 0154    ondansetron  (ZOFRAN -ODT) 4 MG disintegrating tablet  Every 6 hours PRN        11/17/23 0154             Note:  This document was prepared using Dragon voice recognition software and may include unintentional dictation errors.   Shemekia Patane, Josette SAILOR, DO 11/17/23 [phone]

## 2023-11-16 NOTE — ED Triage Notes (Signed)
 Pt to ED via [facility] from work. Pt reports dizziness and near syncopal sensation when lifting head to left side and pointing head up. Pt also reports nausea. No hx of vertigo. Pt reports has been under a lot of stress at work.

## 2023-11-16 NOTE — ED Notes (Signed)
 First Nurse Note: Pt to ED via [facility] from work for near syncope. Pt reports that about 2 hours ago she was getting out of her car and started feeling lightheaded. EMS reports pt cool, pale, and diaphoretic when they arrived and pt was also unable to stand. Pt is diabetic, last CBG was 90. Pt has been helping with move in at [facility] and has been out in the heat all day. Pt was tachycardic with EMS. Pt refused IV with EMS. Pt able to stand with assistance and transfer to toilet on arrival to ED.

## 2023-11-17 LAB — CK: Total CK: 66 U/L (ref 38–234)

## 2023-11-17 LAB — TSH: TSH: 2.055 u[IU]/mL (ref 0.350–4.500)

## 2023-11-17 MED ORDER — MECLIZINE HCL 25 MG PO TABS: 25.0000 mg | ORAL_TABLET | Freq: Three times a day (TID) | ORAL | 0 refills | Status: AC | PRN

## 2023-11-17 MED ORDER — ONDANSETRON 4 MG PO TBDP: 4.0000 mg | ORAL_TABLET | Freq: Four times a day (QID) | ORAL | 0 refills | Status: AC | PRN

## 2023-11-17 NOTE — ED Notes (Signed)
 Pt ambulated to the toilet in the room and denies increased dizziness, gait steady
